# Patient Record
Sex: Male | Born: 1960 | Race: White | Hispanic: No | Marital: Single
Health system: Southern US, Community
[De-identification: ages and names within clinical notes are randomized; demographics above are authoritative.]

---

## 2001-06-05 ENCOUNTER — Ambulatory Visit (HOSPITAL_COMMUNITY): Admission: RE | Admit: 2001-06-05 | Discharge: 2001-06-05 | Payer: Self-pay | Admitting: Emergency Medicine

## 2002-12-05 ENCOUNTER — Encounter: Admission: RE | Admit: 2002-12-05 | Discharge: 2002-12-05 | Payer: Self-pay | Admitting: Emergency Medicine

## 2002-12-05 ENCOUNTER — Encounter: Payer: Self-pay | Admitting: Emergency Medicine

## 2005-08-30 ENCOUNTER — Encounter: Admission: RE | Admit: 2005-08-30 | Discharge: 2005-08-30 | Payer: Self-pay | Admitting: Emergency Medicine

## 2008-02-21 IMAGING — CR DG HAND COMPLETE 3+V*L*
3 series · 3 of 3 positions shown · non-contrast
Comparison: none

CLINICAL DATA: Injured left finger with a saw several weeks ago.
 LEFT HAND ? 3 VIEW:

[view not recorded (1 of 3)]
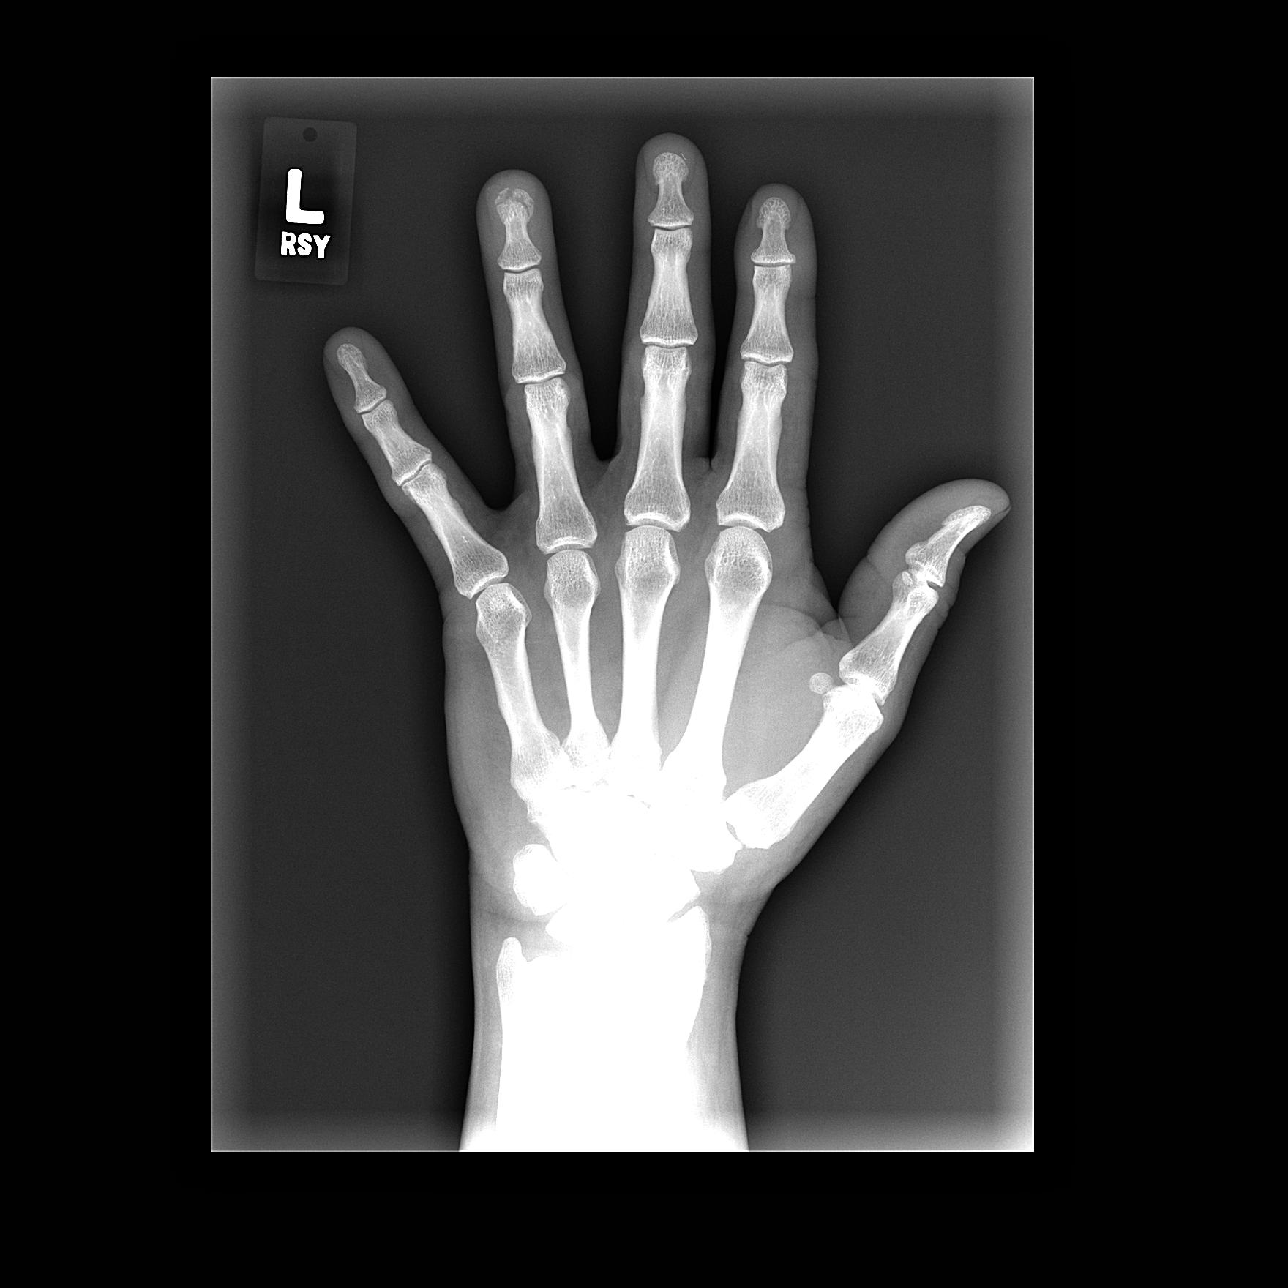

[view not recorded (2 of 3)]
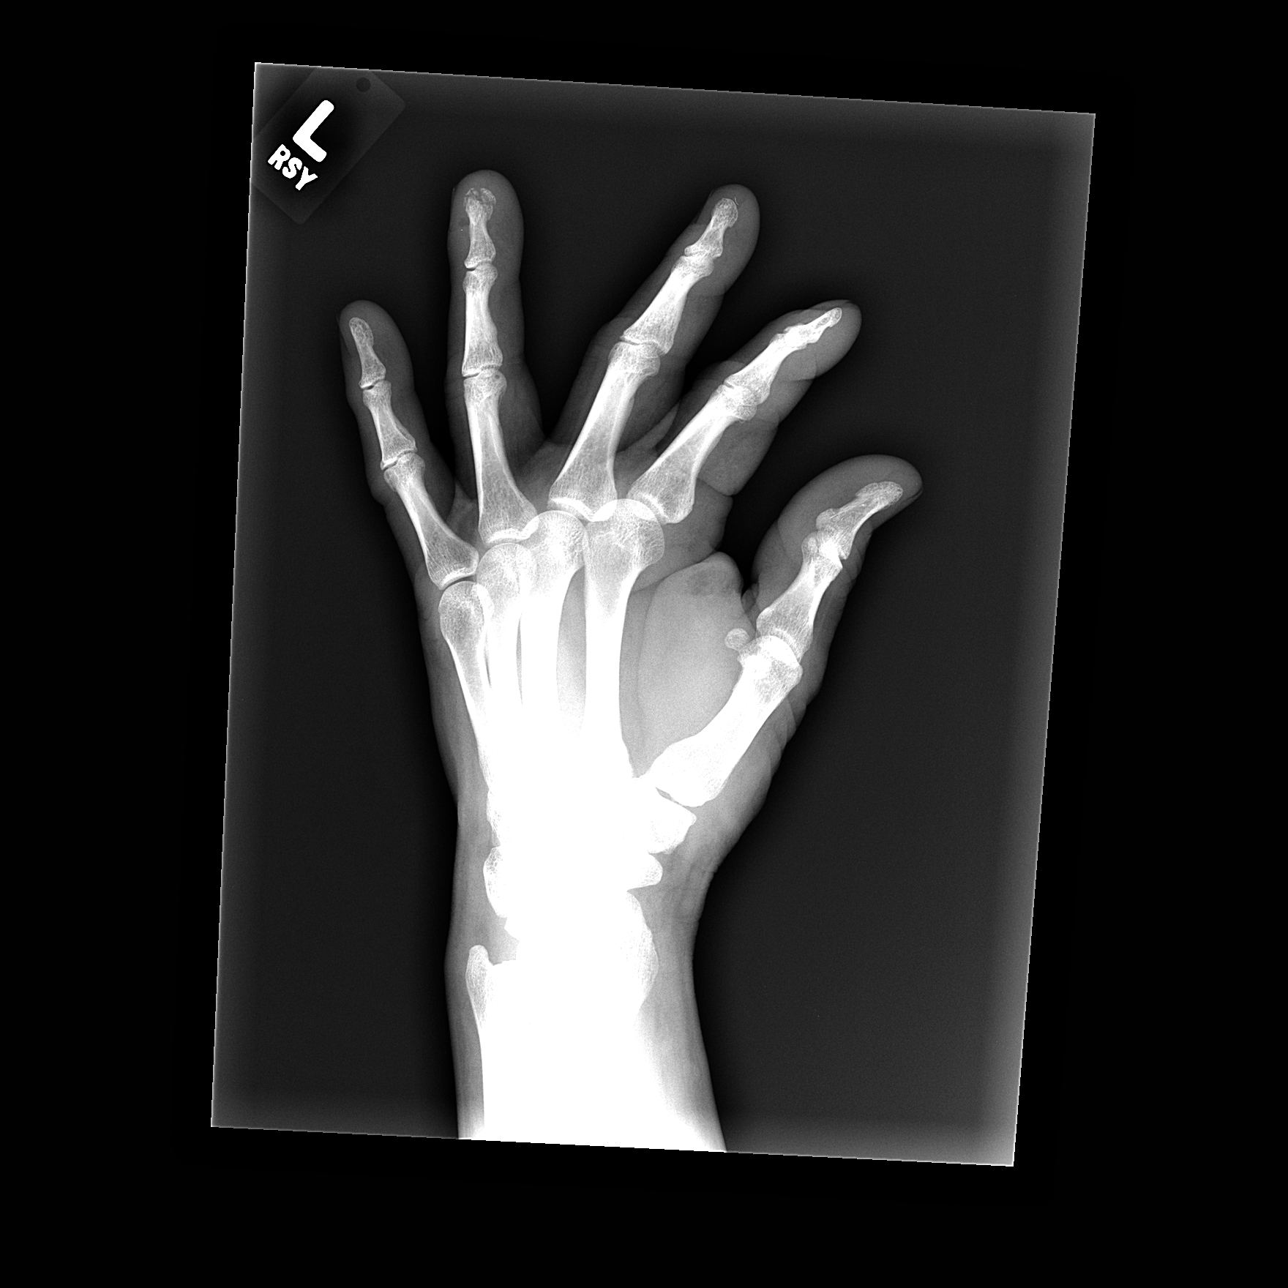

[view not recorded (3 of 3)]
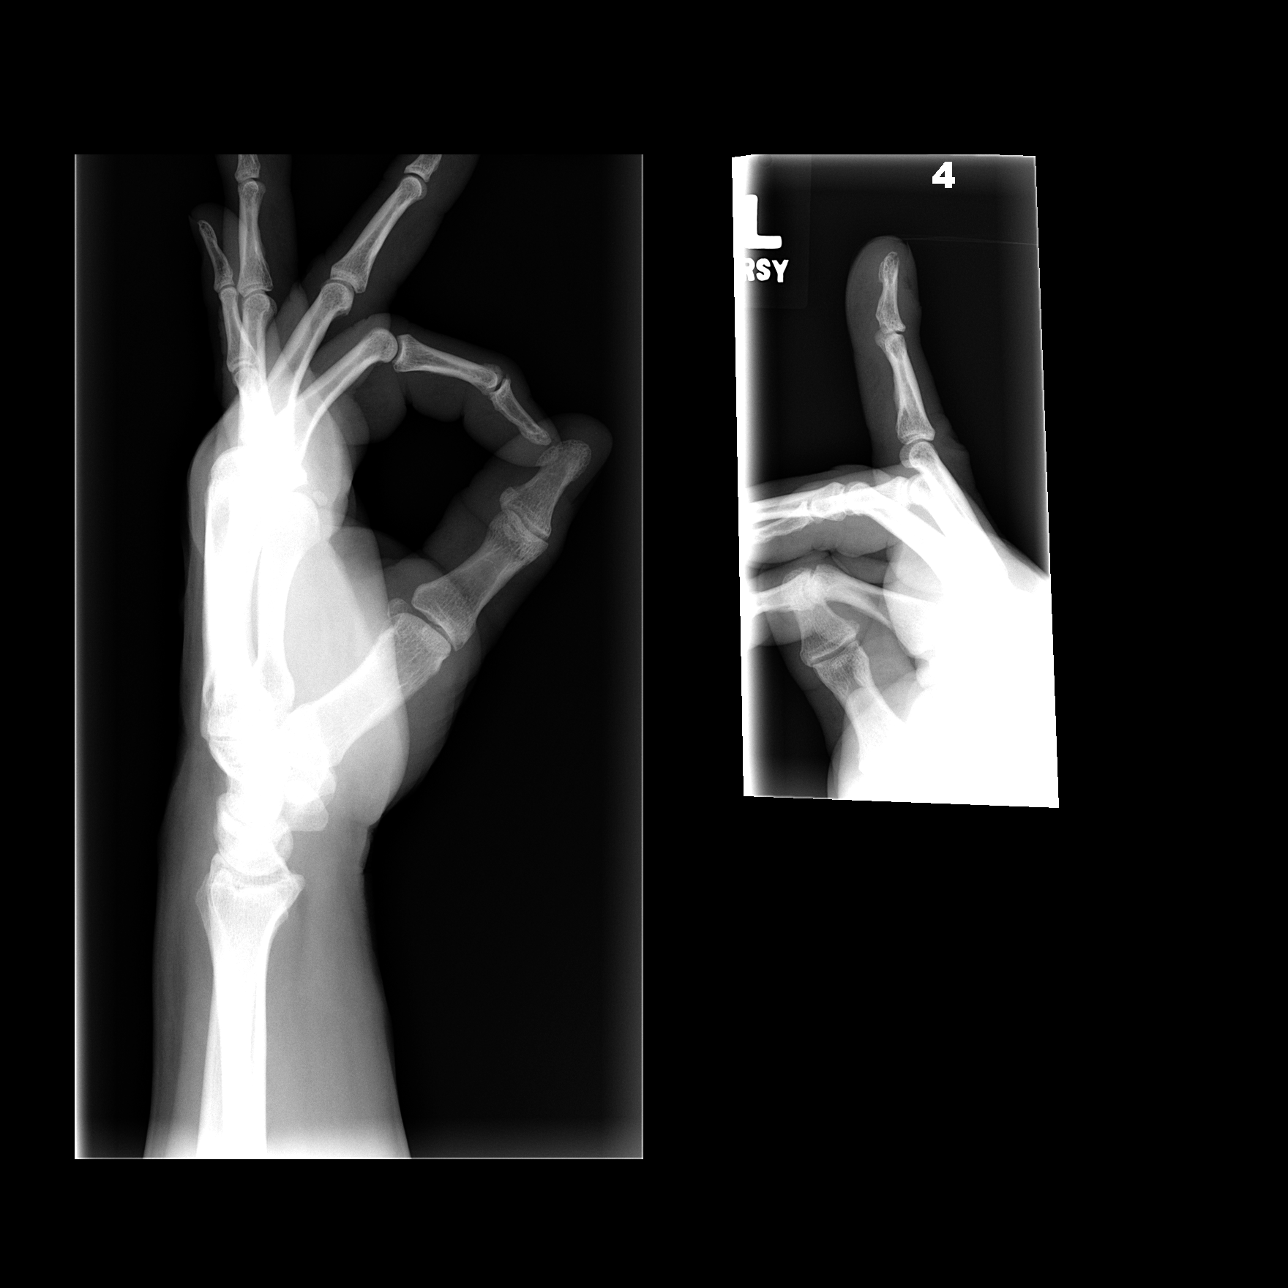

[3 of 3 positions shown; findings below may reference images not displayed]

FINDINGS: Three views of the left hand and a lateral view of the left 4th finger were obtained.  There is fracture of the tuft of the distal phalanx of the left 4th digit with adjacent soft tissue swelling.  No evidence of osteomyelitis is seen.  A small opaque foreign body overlies the nail of the 3rd digit.
IMPRESSION: Fracture of the tuft of the distal phalanx of the left 4th digit. Small opaque foreign body overlies the nail of the left 3rd digit.

## 2008-06-17 ENCOUNTER — Encounter: Admission: RE | Admit: 2008-06-17 | Discharge: 2008-06-17 | Payer: Self-pay | Admitting: Family Medicine

## 2010-12-09 IMAGING — CR DG HIP COMPLETE 2+V*R*
2 series · 2 of 2 positions shown · non-contrast
Comparison: None

CLINICAL DATA: Right hip pain

RIGHT HIP - COMPLETE 2+ VIEW

[view not recorded (1 of 2)]
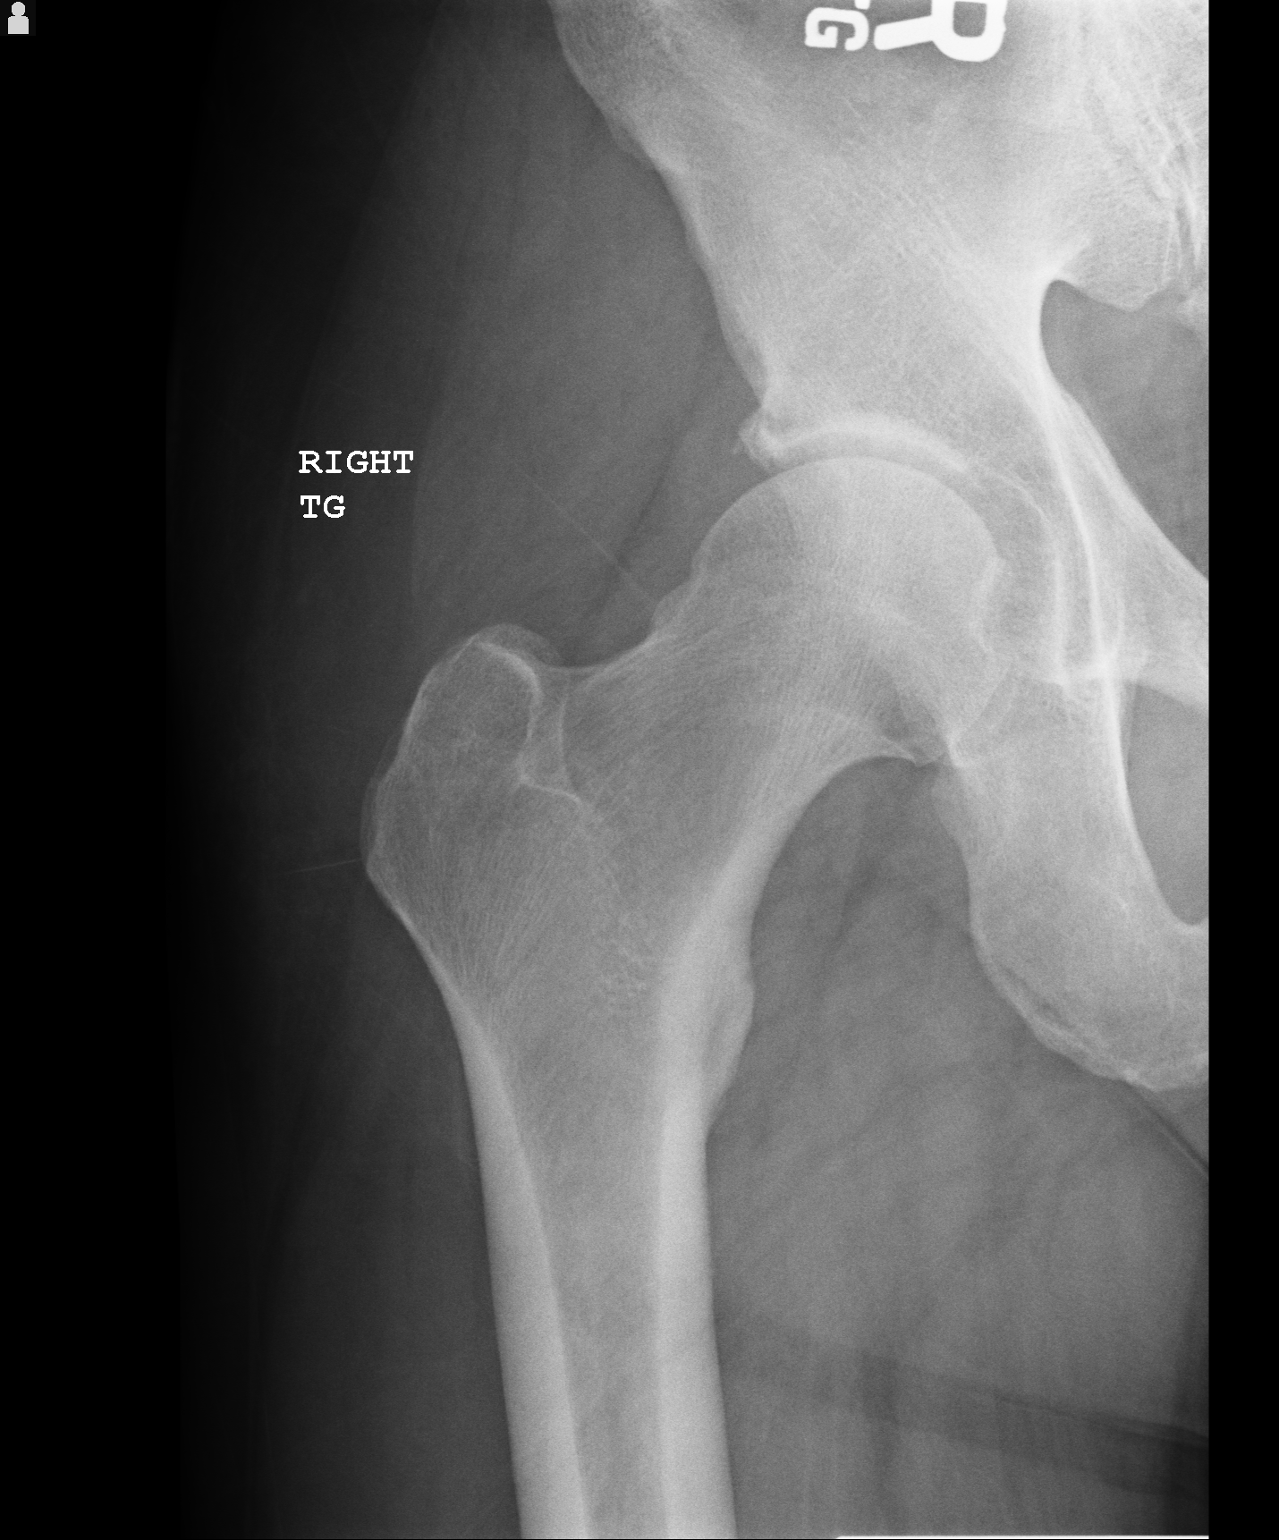

[view not recorded (2 of 2)]
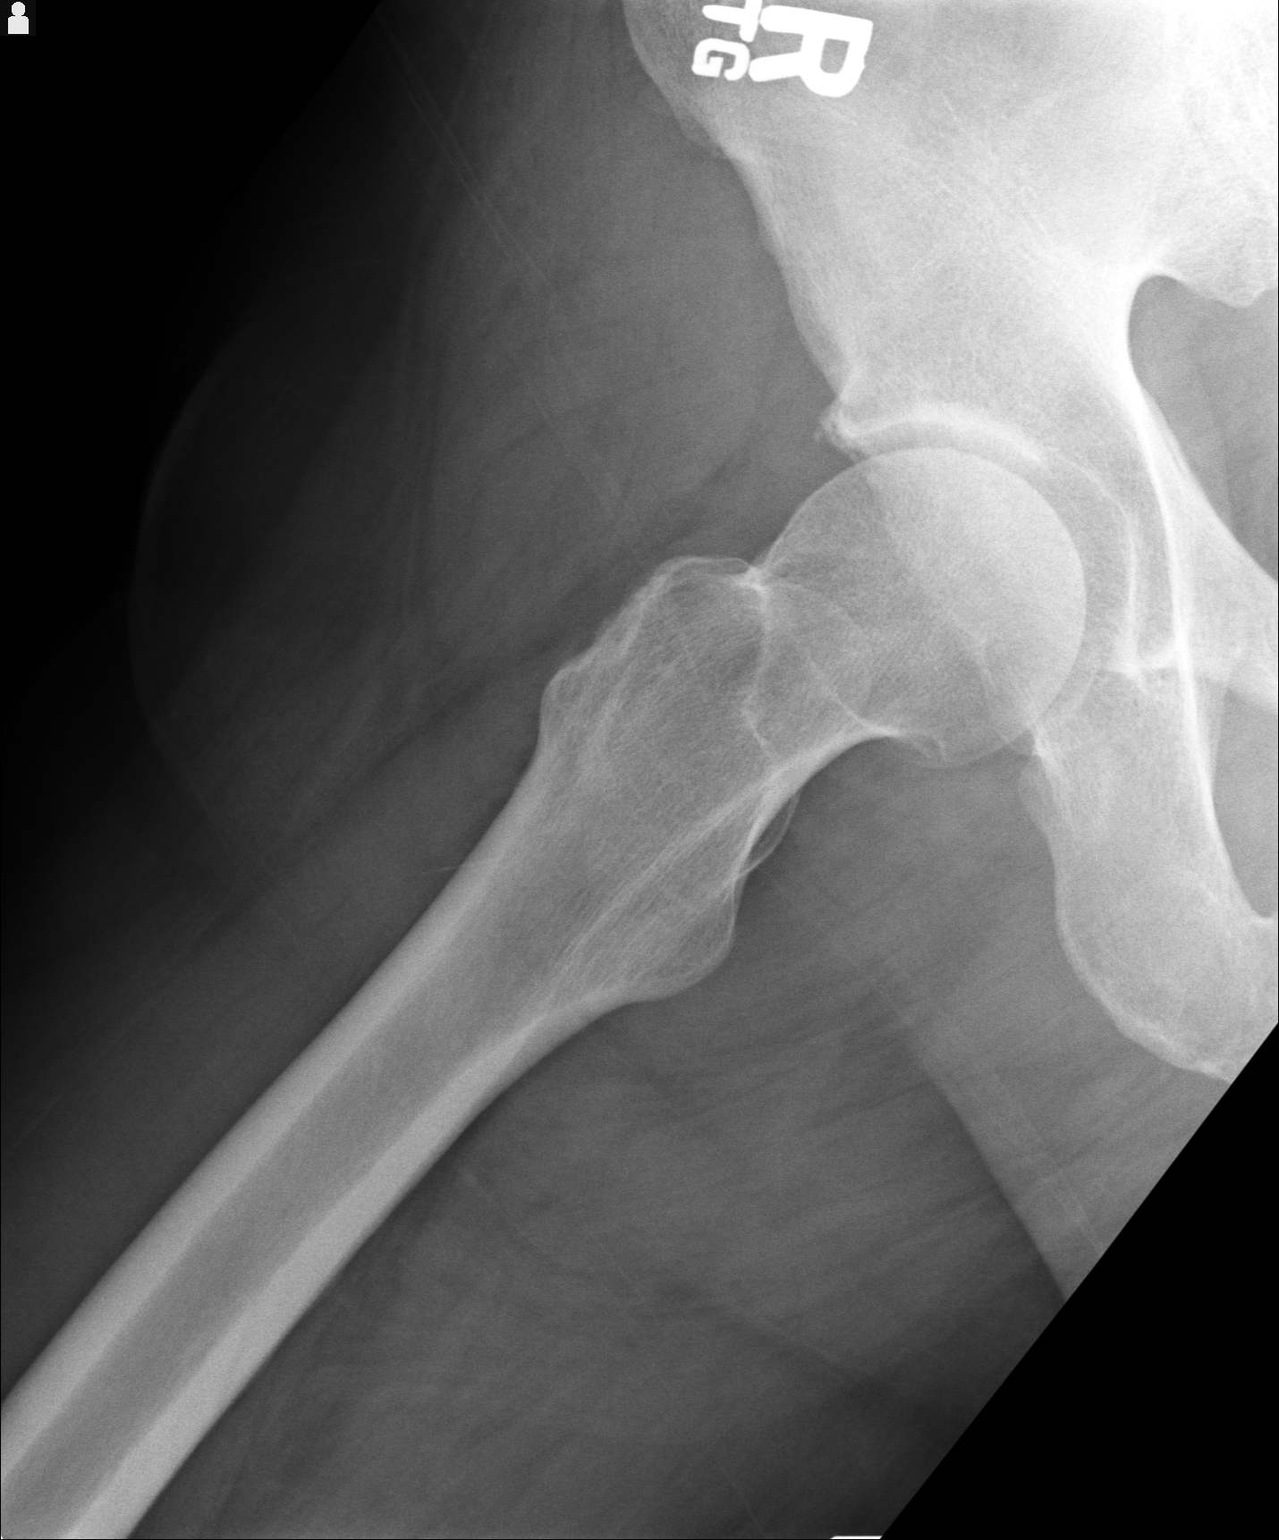

[2 of 2 positions shown; findings below may reference images not displayed]

FINDINGS: No evidence of fracture, dislocation or bony lesion.
Mild osteoarthritis present consisting primarily of superior joint
space loss with associated mild proliferative changes.  No soft
tissue abnormalities.
IMPRESSION: Mild osteoarthritis of right hip.

## 2017-03-05 DIAGNOSIS — Z131 Encounter for screening for diabetes mellitus: Secondary | ICD-10-CM | POA: Diagnosis not present

## 2017-03-05 DIAGNOSIS — Z136 Encounter for screening for cardiovascular disorders: Secondary | ICD-10-CM | POA: Diagnosis not present

## 2017-03-05 DIAGNOSIS — Z13220 Encounter for screening for lipoid disorders: Secondary | ICD-10-CM | POA: Diagnosis not present

## 2017-03-05 DIAGNOSIS — Z713 Dietary counseling and surveillance: Secondary | ICD-10-CM | POA: Diagnosis not present

## 2017-11-28 DIAGNOSIS — G43909 Migraine, unspecified, not intractable, without status migrainosus: Secondary | ICD-10-CM | POA: Diagnosis not present

## 2017-11-28 DIAGNOSIS — K219 Gastro-esophageal reflux disease without esophagitis: Secondary | ICD-10-CM | POA: Diagnosis not present

## 2017-11-28 DIAGNOSIS — Z23 Encounter for immunization: Secondary | ICD-10-CM | POA: Diagnosis not present

## 2017-11-28 DIAGNOSIS — E668 Other obesity: Secondary | ICD-10-CM | POA: Diagnosis not present

## 2017-11-28 DIAGNOSIS — R292 Abnormal reflex: Secondary | ICD-10-CM | POA: Diagnosis not present

## 2017-11-28 DIAGNOSIS — K921 Melena: Secondary | ICD-10-CM | POA: Diagnosis not present

## 2017-11-28 DIAGNOSIS — M109 Gout, unspecified: Secondary | ICD-10-CM | POA: Diagnosis not present

## 2017-11-28 DIAGNOSIS — Z0000 Encounter for general adult medical examination without abnormal findings: Secondary | ICD-10-CM | POA: Diagnosis not present

## 2017-11-28 DIAGNOSIS — Z1389 Encounter for screening for other disorder: Secondary | ICD-10-CM | POA: Diagnosis not present

## 2017-11-28 DIAGNOSIS — M1990 Unspecified osteoarthritis, unspecified site: Secondary | ICD-10-CM | POA: Diagnosis not present

## 2017-11-28 DIAGNOSIS — Z125 Encounter for screening for malignant neoplasm of prostate: Secondary | ICD-10-CM | POA: Diagnosis not present

## 2017-11-28 DIAGNOSIS — R8289 Other abnormal findings on cytological and histological examination of urine: Secondary | ICD-10-CM | POA: Diagnosis not present

## 2017-12-21 DIAGNOSIS — K921 Melena: Secondary | ICD-10-CM | POA: Diagnosis not present

## 2018-01-01 DIAGNOSIS — K219 Gastro-esophageal reflux disease without esophagitis: Secondary | ICD-10-CM | POA: Diagnosis not present

## 2018-01-01 DIAGNOSIS — R1310 Dysphagia, unspecified: Secondary | ICD-10-CM | POA: Diagnosis not present

## 2018-01-01 DIAGNOSIS — R195 Other fecal abnormalities: Secondary | ICD-10-CM | POA: Diagnosis not present

## 2018-02-01 DIAGNOSIS — K635 Polyp of colon: Secondary | ICD-10-CM | POA: Diagnosis not present

## 2018-02-01 DIAGNOSIS — R195 Other fecal abnormalities: Secondary | ICD-10-CM | POA: Diagnosis not present

## 2018-02-01 DIAGNOSIS — K449 Diaphragmatic hernia without obstruction or gangrene: Secondary | ICD-10-CM | POA: Diagnosis not present

## 2018-02-01 DIAGNOSIS — Z1211 Encounter for screening for malignant neoplasm of colon: Secondary | ICD-10-CM | POA: Diagnosis not present

## 2018-02-01 DIAGNOSIS — D123 Benign neoplasm of transverse colon: Secondary | ICD-10-CM | POA: Diagnosis not present

## 2018-02-01 DIAGNOSIS — K5730 Diverticulosis of large intestine without perforation or abscess without bleeding: Secondary | ICD-10-CM | POA: Diagnosis not present

## 2018-02-01 DIAGNOSIS — K222 Esophageal obstruction: Secondary | ICD-10-CM | POA: Diagnosis not present

## 2018-03-22 DIAGNOSIS — M109 Gout, unspecified: Secondary | ICD-10-CM | POA: Diagnosis not present

## 2018-03-22 DIAGNOSIS — J45998 Other asthma: Secondary | ICD-10-CM | POA: Diagnosis not present

## 2018-03-22 DIAGNOSIS — Z6836 Body mass index (BMI) 36.0-36.9, adult: Secondary | ICD-10-CM | POA: Diagnosis not present

## 2018-03-22 DIAGNOSIS — J029 Acute pharyngitis, unspecified: Secondary | ICD-10-CM | POA: Diagnosis not present

## 2018-12-28 DIAGNOSIS — R7989 Other specified abnormal findings of blood chemistry: Secondary | ICD-10-CM | POA: Diagnosis not present

## 2018-12-28 DIAGNOSIS — Z125 Encounter for screening for malignant neoplasm of prostate: Secondary | ICD-10-CM | POA: Diagnosis not present

## 2018-12-28 DIAGNOSIS — M109 Gout, unspecified: Secondary | ICD-10-CM | POA: Diagnosis not present

## 2018-12-28 DIAGNOSIS — Z0000 Encounter for general adult medical examination without abnormal findings: Secondary | ICD-10-CM | POA: Diagnosis not present

## 2019-01-04 DIAGNOSIS — Z0000 Encounter for general adult medical examination without abnormal findings: Secondary | ICD-10-CM | POA: Diagnosis not present

## 2019-01-04 DIAGNOSIS — Z23 Encounter for immunization: Secondary | ICD-10-CM | POA: Diagnosis not present

## 2019-01-04 DIAGNOSIS — D126 Benign neoplasm of colon, unspecified: Secondary | ICD-10-CM | POA: Diagnosis not present

## 2019-01-04 DIAGNOSIS — M109 Gout, unspecified: Secondary | ICD-10-CM | POA: Diagnosis not present

## 2019-01-04 DIAGNOSIS — Z1331 Encounter for screening for depression: Secondary | ICD-10-CM | POA: Diagnosis not present

## 2019-01-04 DIAGNOSIS — K222 Esophageal obstruction: Secondary | ICD-10-CM | POA: Diagnosis not present

## 2019-01-04 DIAGNOSIS — K219 Gastro-esophageal reflux disease without esophagitis: Secondary | ICD-10-CM | POA: Diagnosis not present

## 2019-02-13 DIAGNOSIS — Z1212 Encounter for screening for malignant neoplasm of rectum: Secondary | ICD-10-CM | POA: Diagnosis not present

## 2019-05-22 DIAGNOSIS — Z23 Encounter for immunization: Secondary | ICD-10-CM | POA: Diagnosis not present

## 2019-06-19 DIAGNOSIS — Z23 Encounter for immunization: Secondary | ICD-10-CM | POA: Diagnosis not present

## 2019-10-16 DIAGNOSIS — U071 COVID-19: Secondary | ICD-10-CM | POA: Diagnosis not present

## 2019-10-16 DIAGNOSIS — J3489 Other specified disorders of nose and nasal sinuses: Secondary | ICD-10-CM | POA: Diagnosis not present

## 2019-10-17 ENCOUNTER — Telehealth (HOSPITAL_COMMUNITY): Payer: Self-pay | Admitting: Oncology

## 2019-10-17 NOTE — Telephone Encounter (Signed)
Called to Discuss with patient about Covid symptoms and the use of regeneron, a monoclonal antibody infusion for those with mild to moderate Covid symptoms and at a high risk of hospitalization.     Pt is qualified for this infusion at the [facility] infusion center due to co-morbid conditions and/or a member of an at-risk group.     Mr. Bobby Hernandez would like to do some research on the medication prior to scheduling his appointment.  Will call back with questions.   Mignon Pine, AGNP-C [phone] (Infusion Center Hotline)

## 2019-12-30 DIAGNOSIS — M109 Gout, unspecified: Secondary | ICD-10-CM | POA: Diagnosis not present

## 2019-12-30 DIAGNOSIS — Z125 Encounter for screening for malignant neoplasm of prostate: Secondary | ICD-10-CM | POA: Diagnosis not present

## 2019-12-30 DIAGNOSIS — Z0000 Encounter for general adult medical examination without abnormal findings: Secondary | ICD-10-CM | POA: Diagnosis not present

## 2020-01-06 DIAGNOSIS — J4520 Mild intermittent asthma, uncomplicated: Secondary | ICD-10-CM | POA: Diagnosis not present

## 2020-01-06 DIAGNOSIS — Z0000 Encounter for general adult medical examination without abnormal findings: Secondary | ICD-10-CM | POA: Diagnosis not present

## 2020-01-06 DIAGNOSIS — Z23 Encounter for immunization: Secondary | ICD-10-CM | POA: Diagnosis not present

## 2020-01-15 DIAGNOSIS — Z1212 Encounter for screening for malignant neoplasm of rectum: Secondary | ICD-10-CM | POA: Diagnosis not present

## 2020-01-23 DIAGNOSIS — M25562 Pain in left knee: Secondary | ICD-10-CM | POA: Diagnosis not present

## 2021-01-13 DIAGNOSIS — R7989 Other specified abnormal findings of blood chemistry: Secondary | ICD-10-CM | POA: Diagnosis not present

## 2021-01-13 DIAGNOSIS — Z79899 Other long term (current) drug therapy: Secondary | ICD-10-CM | POA: Diagnosis not present

## 2021-01-13 DIAGNOSIS — M109 Gout, unspecified: Secondary | ICD-10-CM | POA: Diagnosis not present

## 2021-01-13 DIAGNOSIS — Z125 Encounter for screening for malignant neoplasm of prostate: Secondary | ICD-10-CM | POA: Diagnosis not present

## 2021-01-13 DIAGNOSIS — Z0000 Encounter for general adult medical examination without abnormal findings: Secondary | ICD-10-CM | POA: Diagnosis not present

## 2021-01-20 DIAGNOSIS — Z23 Encounter for immunization: Secondary | ICD-10-CM | POA: Diagnosis not present

## 2021-01-20 DIAGNOSIS — Z1331 Encounter for screening for depression: Secondary | ICD-10-CM | POA: Diagnosis not present

## 2021-01-20 DIAGNOSIS — Z1339 Encounter for screening examination for other mental health and behavioral disorders: Secondary | ICD-10-CM | POA: Diagnosis not present

## 2021-01-20 DIAGNOSIS — K222 Esophageal obstruction: Secondary | ICD-10-CM | POA: Diagnosis not present

## 2021-01-20 DIAGNOSIS — Z1212 Encounter for screening for malignant neoplasm of rectum: Secondary | ICD-10-CM | POA: Diagnosis not present

## 2021-01-20 DIAGNOSIS — Z0000 Encounter for general adult medical examination without abnormal findings: Secondary | ICD-10-CM | POA: Diagnosis not present

## 2021-01-20 DIAGNOSIS — J4520 Mild intermittent asthma, uncomplicated: Secondary | ICD-10-CM | POA: Diagnosis not present

## 2021-01-20 DIAGNOSIS — R82998 Other abnormal findings in urine: Secondary | ICD-10-CM | POA: Diagnosis not present

## 2021-01-20 DIAGNOSIS — K219 Gastro-esophageal reflux disease without esophagitis: Secondary | ICD-10-CM | POA: Diagnosis not present

## 2021-01-20 DIAGNOSIS — G43909 Migraine, unspecified, not intractable, without status migrainosus: Secondary | ICD-10-CM | POA: Diagnosis not present

## 2021-04-28 DIAGNOSIS — R062 Wheezing: Secondary | ICD-10-CM | POA: Diagnosis not present

## 2021-04-28 DIAGNOSIS — J069 Acute upper respiratory infection, unspecified: Secondary | ICD-10-CM | POA: Diagnosis not present

## 2021-05-07 DIAGNOSIS — R062 Wheezing: Secondary | ICD-10-CM | POA: Diagnosis not present

## 2021-07-28 DIAGNOSIS — H1013 Acute atopic conjunctivitis, bilateral: Secondary | ICD-10-CM | POA: Diagnosis not present

## 2022-01-21 DIAGNOSIS — Z79899 Other long term (current) drug therapy: Secondary | ICD-10-CM | POA: Diagnosis not present

## 2022-01-21 DIAGNOSIS — Z125 Encounter for screening for malignant neoplasm of prostate: Secondary | ICD-10-CM | POA: Diagnosis not present

## 2022-01-21 DIAGNOSIS — M109 Gout, unspecified: Secondary | ICD-10-CM | POA: Diagnosis not present

## 2022-01-21 DIAGNOSIS — R7989 Other specified abnormal findings of blood chemistry: Secondary | ICD-10-CM | POA: Diagnosis not present

## 2022-01-27 DIAGNOSIS — K222 Esophageal obstruction: Secondary | ICD-10-CM | POA: Diagnosis not present

## 2022-01-27 DIAGNOSIS — R82998 Other abnormal findings in urine: Secondary | ICD-10-CM | POA: Diagnosis not present

## 2022-01-27 DIAGNOSIS — M109 Gout, unspecified: Secondary | ICD-10-CM | POA: Diagnosis not present

## 2022-01-27 DIAGNOSIS — K219 Gastro-esophageal reflux disease without esophagitis: Secondary | ICD-10-CM | POA: Diagnosis not present

## 2022-01-27 DIAGNOSIS — Z1339 Encounter for screening examination for other mental health and behavioral disorders: Secondary | ICD-10-CM | POA: Diagnosis not present

## 2022-01-27 DIAGNOSIS — Z1212 Encounter for screening for malignant neoplasm of rectum: Secondary | ICD-10-CM | POA: Diagnosis not present

## 2022-01-27 DIAGNOSIS — Z0000 Encounter for general adult medical examination without abnormal findings: Secondary | ICD-10-CM | POA: Diagnosis not present

## 2022-01-27 DIAGNOSIS — G43909 Migraine, unspecified, not intractable, without status migrainosus: Secondary | ICD-10-CM | POA: Diagnosis not present

## 2022-01-27 DIAGNOSIS — Z1331 Encounter for screening for depression: Secondary | ICD-10-CM | POA: Diagnosis not present

## 2022-11-03 DIAGNOSIS — M25562 Pain in left knee: Secondary | ICD-10-CM | POA: Diagnosis not present

## 2023-01-27 DIAGNOSIS — Z1212 Encounter for screening for malignant neoplasm of rectum: Secondary | ICD-10-CM | POA: Diagnosis not present

## 2023-01-27 DIAGNOSIS — Z125 Encounter for screening for malignant neoplasm of prostate: Secondary | ICD-10-CM | POA: Diagnosis not present

## 2023-01-27 DIAGNOSIS — M109 Gout, unspecified: Secondary | ICD-10-CM | POA: Diagnosis not present

## 2023-01-27 DIAGNOSIS — R7989 Other specified abnormal findings of blood chemistry: Secondary | ICD-10-CM | POA: Diagnosis not present

## 2023-02-03 DIAGNOSIS — Z1331 Encounter for screening for depression: Secondary | ICD-10-CM | POA: Diagnosis not present

## 2023-02-03 DIAGNOSIS — Z1339 Encounter for screening examination for other mental health and behavioral disorders: Secondary | ICD-10-CM | POA: Diagnosis not present

## 2023-02-03 DIAGNOSIS — R82998 Other abnormal findings in urine: Secondary | ICD-10-CM | POA: Diagnosis not present

## 2023-02-03 DIAGNOSIS — J4520 Mild intermittent asthma, uncomplicated: Secondary | ICD-10-CM | POA: Diagnosis not present

## 2023-02-03 DIAGNOSIS — Z0000 Encounter for general adult medical examination without abnormal findings: Secondary | ICD-10-CM | POA: Diagnosis not present

## 2023-02-03 DIAGNOSIS — Z23 Encounter for immunization: Secondary | ICD-10-CM | POA: Diagnosis not present

## 2023-02-27 DIAGNOSIS — K625 Hemorrhage of anus and rectum: Secondary | ICD-10-CM | POA: Diagnosis not present

## 2023-02-27 DIAGNOSIS — R194 Change in bowel habit: Secondary | ICD-10-CM | POA: Diagnosis not present

## 2023-02-27 DIAGNOSIS — Z1211 Encounter for screening for malignant neoplasm of colon: Secondary | ICD-10-CM | POA: Diagnosis not present

## 2023-03-14 DIAGNOSIS — Z1211 Encounter for screening for malignant neoplasm of colon: Secondary | ICD-10-CM | POA: Diagnosis not present

## 2023-03-14 DIAGNOSIS — Z860101 Personal history of adenomatous and serrated colon polyps: Secondary | ICD-10-CM | POA: Diagnosis not present

## 2023-03-14 DIAGNOSIS — K529 Noninfective gastroenteritis and colitis, unspecified: Secondary | ICD-10-CM | POA: Diagnosis not present

## 2023-03-14 DIAGNOSIS — K625 Hemorrhage of anus and rectum: Secondary | ICD-10-CM | POA: Diagnosis not present

## 2023-03-14 DIAGNOSIS — K5730 Diverticulosis of large intestine without perforation or abscess without bleeding: Secondary | ICD-10-CM | POA: Diagnosis not present

## 2023-03-14 DIAGNOSIS — R194 Change in bowel habit: Secondary | ICD-10-CM | POA: Diagnosis not present

## 2023-04-12 DIAGNOSIS — K51211 Ulcerative (chronic) proctitis with rectal bleeding: Secondary | ICD-10-CM | POA: Diagnosis not present

## 2023-04-24 DIAGNOSIS — D225 Melanocytic nevi of trunk: Secondary | ICD-10-CM | POA: Diagnosis not present

## 2023-04-24 DIAGNOSIS — D485 Neoplasm of uncertain behavior of skin: Secondary | ICD-10-CM | POA: Diagnosis not present

## 2023-04-24 DIAGNOSIS — L814 Other melanin hyperpigmentation: Secondary | ICD-10-CM | POA: Diagnosis not present

## 2023-04-24 DIAGNOSIS — L821 Other seborrheic keratosis: Secondary | ICD-10-CM | POA: Diagnosis not present

## 2023-04-24 DIAGNOSIS — D1801 Hemangioma of skin and subcutaneous tissue: Secondary | ICD-10-CM | POA: Diagnosis not present

## 2023-07-12 DIAGNOSIS — K51211 Ulcerative (chronic) proctitis with rectal bleeding: Secondary | ICD-10-CM | POA: Diagnosis not present

## 2023-07-25 DIAGNOSIS — K5190 Ulcerative colitis, unspecified, without complications: Secondary | ICD-10-CM | POA: Diagnosis not present

## 2023-07-25 DIAGNOSIS — K51211 Ulcerative (chronic) proctitis with rectal bleeding: Secondary | ICD-10-CM | POA: Diagnosis not present

## 2024-02-08 DIAGNOSIS — R82998 Other abnormal findings in urine: Secondary | ICD-10-CM | POA: Diagnosis not present
